# Patient Record
Sex: Female | Born: 2000 | Race: White | Hispanic: No | Marital: Single | Smoking: Never smoker
Health system: Southern US, Community
[De-identification: ages and names within clinical notes are randomized; demographics above are authoritative.]

---
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
## 2021-04-14 ENCOUNTER — Other Ambulatory Visit: Payer: Self-pay

## 2021-04-14 ENCOUNTER — Emergency Department
Admission: EM | Admit: 2021-04-14 | Discharge: 2021-04-14 | Disposition: A | Discharge status: Home / Self Care | Payer: Medicaid Other | Attending: Emergency Medicine | Admitting: Emergency Medicine

## 2021-04-14 ENCOUNTER — Emergency Department: Payer: Medicaid Other

## 2021-04-14 DIAGNOSIS — J029 Acute pharyngitis, unspecified: Secondary | ICD-10-CM | POA: Diagnosis not present

## 2021-04-14 DIAGNOSIS — T148XXA Other injury of unspecified body region, initial encounter: Secondary | ICD-10-CM

## 2021-04-14 DIAGNOSIS — H6982 Other specified disorders of Eustachian tube, left ear: Secondary | ICD-10-CM

## 2021-04-14 DIAGNOSIS — Z20822 Contact with and (suspected) exposure to covid-19: Secondary | ICD-10-CM | POA: Diagnosis not present

## 2021-04-14 DIAGNOSIS — R110 Nausea: Secondary | ICD-10-CM | POA: Insufficient documentation

## 2021-04-14 DIAGNOSIS — R55 Syncope and collapse: Secondary | ICD-10-CM | POA: Insufficient documentation

## 2021-04-14 DIAGNOSIS — H6992 Unspecified Eustachian tube disorder, left ear: Secondary | ICD-10-CM | POA: Diagnosis not present

## 2021-04-14 DIAGNOSIS — L089 Local infection of the skin and subcutaneous tissue, unspecified: Secondary | ICD-10-CM

## 2021-04-14 DIAGNOSIS — R0981 Nasal congestion: Secondary | ICD-10-CM | POA: Insufficient documentation

## 2021-04-14 DIAGNOSIS — R590 Localized enlarged lymph nodes: Secondary | ICD-10-CM | POA: Diagnosis not present

## 2021-04-14 DIAGNOSIS — B349 Viral infection, unspecified: Secondary | ICD-10-CM

## 2021-04-14 LAB — COMPREHENSIVE METABOLIC PANEL
ALT: 10 U/L (ref 0–44)
AST: 20 U/L (ref 15–41)
Albumin: 4.2 g/dL (ref 3.5–5.0)
Alkaline Phosphatase: 73 U/L (ref 38–126)
Anion gap: 10 (ref 5–15)
BUN: 10 mg/dL (ref 6–20)
CO2: 21 mmol/L — ABNORMAL LOW (ref 22–32)
Calcium: 9 mg/dL (ref 8.9–10.3)
Chloride: 101 mmol/L (ref 98–111)
Creatinine, Ser: 0.65 mg/dL (ref 0.44–1.00)
GFR, Estimated: >60 mL/min (ref >60)
Glucose, Bld: 87 mg/dL (ref 70–99)
Potassium: 3.7 mmol/L (ref 3.5–5.1)
Sodium: 132 mmol/L — ABNORMAL LOW (ref 135–145)
Total Bilirubin: 0.8 mg/dL (ref 0.3–1.2)
Total Protein: 8.3 g/dL — ABNORMAL HIGH (ref 6.5–8.1)

## 2021-04-14 LAB — CBG MONITORING, ED: Glucose-Capillary: 94 mg/dL (ref 70–99)

## 2021-04-14 LAB — RESP PANEL BY RT-PCR (FLU A&B, COVID) ARPGX2
Influenza A by PCR: NEGATIVE
Influenza B by PCR: NEGATIVE
SARS Coronavirus 2 by RT PCR: NEGATIVE

## 2021-04-14 LAB — CBC WITH DIFFERENTIAL/PLATELET
Abs Immature Granulocytes: 0.06 10*3/uL (ref 0.00–0.07)
Basophils Absolute: 0.1 10*3/uL (ref 0.0–0.1)
Basophils Relative: 1 %
Eosinophils Absolute: 0.1 10*3/uL (ref 0.0–0.5)
Eosinophils Relative: 1 %
HCT: 41.6 % (ref 36.0–46.0)
Hemoglobin: 13.7 g/dL (ref 12.0–15.0)
Immature Granulocytes: 1 %
Lymphocytes Relative: 15 %
Lymphs Abs: 1.8 10*3/uL (ref 0.7–4.0)
MCH: 29.4 pg (ref 26.0–34.0)
MCHC: 32.9 g/dL (ref 30.0–36.0)
MCV: 89.3 fL (ref 80.0–100.0)
Monocytes Absolute: 0.8 10*3/uL (ref 0.1–1.0)
Monocytes Relative: 7 %
Neutro Abs: 8.7 10*3/uL — ABNORMAL HIGH (ref 1.7–7.7)
Neutrophils Relative %: 75 %
Platelets: 346 10*3/uL (ref 150–400)
RBC: 4.66 MIL/uL (ref 3.87–5.11)
RDW: 12 % (ref 11.5–15.5)
WBC: 11.5 10*3/uL — ABNORMAL HIGH (ref 4.0–10.5)
nRBC: 0 % (ref 0.0–0.2)

## 2021-04-14 LAB — GROUP A STREP BY PCR: Group A Strep by PCR: NOT DETECTED

## 2021-04-14 LAB — TROPONIN I (HIGH SENSITIVITY): Troponin I (High Sensitivity): <2 ng/L (ref <18)

## 2021-04-14 LAB — URINALYSIS, ROUTINE W REFLEX MICROSCOPIC
Bacteria, UA: NONE SEEN
Bilirubin Urine: NEGATIVE
Glucose, UA: NEGATIVE mg/dL
Hgb urine dipstick: NEGATIVE
Ketones, ur: NEGATIVE mg/dL
Leukocytes,Ua: NEGATIVE
Nitrite: NEGATIVE
Protein, ur: NEGATIVE mg/dL
Specific Gravity, Urine: <1.005 — ABNORMAL LOW (ref 1.005–1.030)
pH: 6.5 (ref 5.0–8.0)

## 2021-04-14 LAB — D-DIMER, QUANTITATIVE: D-Dimer, Quant: 0.3 ug/mL-FEU (ref 0.00–0.50)

## 2021-04-14 LAB — POC URINE PREG, ED: Preg Test, Ur: NEGATIVE

## 2021-04-14 MED ORDER — SODIUM CHLORIDE 0.9 % IV BOLUS
1000.0000 mL | Freq: Once | INTRAVENOUS | Status: AC
  Administered 2021-04-14: 1000 mL via INTRAVENOUS

## 2021-04-14 MED ORDER — CEPHALEXIN 500 MG PO CAPS
500.0000 mg | ORAL_CAPSULE | Freq: Once | ORAL | Status: AC
  Administered 2021-04-14: 500 mg via ORAL
  Filled 2021-04-14: qty 1

## 2021-04-14 MED ORDER — CEPHALEXIN 500 MG PO CAPS
500.0000 mg | ORAL_CAPSULE | Freq: Three times a day (TID) | ORAL | 0 refills | Status: AC
Start: 2021-04-14 — End: ?

## 2021-04-14 MED ORDER — METHYLPREDNISOLONE 4 MG PO TBPK: ORAL_TABLET | ORAL | 0 refills | Status: AC

## 2021-04-14 MED ORDER — PREDNISONE 20 MG PO TABS
30.0000 mg | ORAL_TABLET | Freq: Once | ORAL | Status: AC
  Administered 2021-04-14: 30 mg via ORAL
  Filled 2021-04-14: qty 1

## 2021-04-14 NOTE — Discharge Instructions (Addendum)
1.  Take antibiotic as prescribed (Keflex 500mg  3 times daily x7 days). 2.  Take Medrol Dosepak as prescribed. 3.  I recommend you remove the infected earring.  Keep wound clean and dry. 4.  Return to the ER for worsening symptoms, persistent vomiting, difficulty breathing or other concerns

## 2021-04-14 NOTE — ED Notes (Signed)
Pt returned from radiology.

## 2021-04-14 NOTE — ED Provider Notes (Signed)
[facility] Provider Note    Event Date/Time   First MD Initiated Contact with Patient 04/14/21 [phone]     (approximate)   History   Loss of Consciousness   HPI  Nicole Duke is a 21 y.o. female who presents to the ED from home status post syncopal episode.  Patient woke up around 2 AM because her left ear piercing was hurting her.  Recently pierced approximately 3 weeks ago and patient thinks it is infected.  She went to the restroom with her friend in order to clean her ear.  Felt very lightheaded like she was going to pass out so she kneeled down and wedged her head in a corner.  Friend states patient was out for approximately 45 seconds, vomited x1 when she came to.  Patient reports recent cold-like symptoms with congestion and sinus pressure.  Felt like her left ear was going to explode at dinner associated with nausea and dizziness.  Also complaining of sore throat and swollen lymph nodes.  Denies chest pain, shortness of breath, abdominal pain, vomiting or diarrhea.  Travel to [place] 3 weeks ago, takes birth control.  Has not smoked marijuana in several weeks.     Past Medical History  History reviewed. No pertinent past medical history.   Active Problem List  There are no problems to display for this patient.    Past Surgical History  History reviewed. No pertinent surgical history.   Home Medications   Prior to Admission medications   Medication Sig Start Date End Date Taking? Authorizing Provider  [place] 28 0.25-35 MG-MCG tablet Take 1 tablet by mouth daily. 04/01/21  Yes [provider]     Allergies  Patient has no known allergies.   Family History  History reviewed. No pertinent family history.   Physical Exam  Triage Vital Signs: ED Triage Vitals  Enc Vitals Group     BP 04/14/21 0429 119/76     Pulse Rate 04/14/21 0429 100     Resp 04/14/21 0429 (!) 22     Temp 04/14/21 0429 97.9 F (36.6 C)     Temp  Source 04/14/21 0429 Oral     SpO2 04/14/21 0429 98 %     Weight 04/14/21 0428 130 lb (59 kg)     Height 04/14/21 0428 5\' 5"  (1.651 m)     Head Circumference --      Peak Flow --      Pain Score 04/14/21 0427 0     Pain Loc --      Pain Edu? --      Excl. in [place]? --     Updated Vital Signs: BP 116/60    Pulse 96    Temp 97.9 F (36.6 C) (Oral)    Resp 18    Ht 5\' 5"  (1.651 m)    Wt 59 kg    LMP 04/12/2021 (Approximate)    SpO2 100%    BMI 21.63 kg/m    General: Awake, no distress.  CV:  RRR.  Good peripheral perfusion.  Resp:  Normal effort.  CTAB. Abd:  Nontender to light or deep palpation.  No distention.  Other:  No carotid bruits.  Supple neck without meningismus.  Alert and oriented x3.  CN II to XII grossly intact.  5/5 motor strength and sensation all extremities. MAEx4.  No petechiae.  Left TM with fluid, no erythema or rupture.  Left upper ear piercing mildly swollen and oozing.  Oropharynx moderately  erythematous without tonsillar swelling, exudates or peritonsillar abscess.  There is no hoarse voice.  There is no drooling.  Shotty anterior cervical lymphadenopathy.   ED Results / Procedures / Treatments  Labs (all labs ordered are listed, but only abnormal results are displayed) Labs Reviewed  CBC WITH DIFFERENTIAL/PLATELET - Abnormal; Notable for the following components:      Result Value   WBC 11.5 (*)    Neutro Abs 8.7 (*)    All other components within normal limits  COMPREHENSIVE METABOLIC PANEL - Abnormal; Notable for the following components:   Sodium 132 (*)    CO2 21 (*)    Total Protein 8.3 (*)    All other components within normal limits  URINALYSIS, ROUTINE W REFLEX MICROSCOPIC - Abnormal; Notable for the following components:   APPearance CLEAR (*)    Specific Gravity, Urine <1.005 (*)    All other components within normal limits  RESP PANEL BY RT-PCR (FLU A&B, COVID) ARPGX2  GROUP A STREP BY PCR  D-DIMER, QUANTITATIVE (NOT AT [facility])  CBG  MONITORING, ED  POC URINE PREG, ED  TROPONIN I (HIGH SENSITIVITY)     EKG  ED ECG REPORT I, Sharni Negron J, the attending physician, personally viewed and interpreted this ECG.   Date: 04/14/2021  EKG Time: 0431  Rate: 97  Rhythm: normal sinus rhythm  Axis: Normal  Intervals:none  ST&T Change: Nonspecific    RADIOLOGY I have personally reviewed patient's CT head and chest x-ray, as well as the radiology interpretation:  CT head: No ICH  Chest x-ray: No acute cardiopulmonary process  Official radiology report(s): DG Chest 2 View  Result Date: 04/14/2021 CLINICAL DATA:  21 year old female with history of syncope. EXAM: CHEST - 2 VIEW COMPARISON:  No priors. FINDINGS: Lung volumes are normal. No consolidative airspace disease. No pleural effusions. No pneumothorax. No pulmonary nodule or mass noted. Pulmonary vasculature and the cardiomediastinal silhouette are within normal limits. IMPRESSION: No radiographic evidence of acute cardiopulmonary disease. Electronically Signed   By: Vinnie Langton M.D.   On: 04/14/2021 05:26   CT Head Wo Contrast  Result Date: 04/14/2021 CLINICAL DATA:  21 year old female with history of altered mental status. EXAM: CT HEAD WITHOUT CONTRAST TECHNIQUE: Contiguous axial images were obtained from the base of the skull through the vertex without intravenous contrast. RADIATION DOSE REDUCTION: This exam was performed according to the departmental dose-optimization program which includes automated exposure control, adjustment of the mA and/or kV according to patient size and/or use of iterative reconstruction technique. COMPARISON:  No priors. FINDINGS: Brain: No evidence of acute infarction, hemorrhage, hydrocephalus, extra-axial collection or mass lesion/mass effect. Vascular: No hyperdense vessel or unexpected calcification. Skull: Normal. Negative for fracture or focal lesion. Sinuses/Orbits: No acute finding. Mild mucosal thickening in the left maxillary  sinus. Other: None. IMPRESSION: 1. No acute intracranial abnormalities. The appearance of the brain is normal. Electronically Signed   By: Vinnie Langton M.D.   On: 04/14/2021 05:25     PROCEDURES:  Critical Care performed: No  .1-3 Lead EKG Interpretation Performed by: Paulette Blanch, MD Authorized by: Paulette Blanch, MD     Interpretation: normal     ECG rate:  96   ECG rate assessment: normal     Rhythm: sinus rhythm     Ectopy: none     Conduction: normal   Comments:     Patient on cardiac monitor to evaluate for arrhythmias   MEDICATIONS ORDERED IN ED: Medications  sodium chloride  0.9 % bolus 1,000 mL (1,000 mLs Intravenous New Bag/Given 04/14/21 0456)     IMPRESSION / MDM / ASSESSMENT AND PLAN / ED COURSE  I reviewed the triage vital signs and the nursing notes.                             21 year old female presenting with syncope.  Differential diagnosis includes but is not limited to infectious, metabolic, pregnancy, dehydration, viral illness, ACS, PE, etc.  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  The patient is on the cardiac monitor to evaluate for evidence of arrhythmia and/or significant heart rate changes.  Will obtain lab work, imaging studies.  Initiate IV fluid resuscitation.  Will reassess.  Clinical Course as of 04/14/21 [id number]  Fri Apr 14, 2021  0556 Patient was slightly orthostatic by pulse rate.  Laboratory results remarkable for normal H/H, mild hyponatremia, negative troponin, unremarkable D-dimer, negative respiratory panel and group A strep.  IV fluids are infusing.  Urine pregnancy is negative.  Awaiting results of UA.  Patient currently resting in no acute distress.  Voices no complaints. [JS]  [id number] Updated patient on negative UA.  Will place on Medrol Dosepak for eustachian tube dysfunction, Keflex for infected ear piercing and patient will follow up with her PCP.   Strict return precautions given.  Patient and family member verbalized understanding agree with plan of care. [JS]    Clinical Course User Index [JS] Paulette Blanch, MD     FINAL CLINICAL IMPRESSION(S) / ED DIAGNOSES   Final diagnoses:  Syncope, unspecified syncope type  Dysfunction of left eustachian tube     Rx / DC Orders   ED Discharge Orders     None        Note:  This document was prepared using Dragon voice recognition software and may include unintentional dictation errors.   Paulette Blanch, MD 04/14/21 [phone]

## 2021-04-14 NOTE — ED Notes (Signed)
Patient transported to CT 

## 2021-04-14 NOTE — ED Triage Notes (Signed)
Pt presents to ER c/o episode of LOC.  Pt states around 0200 she was going to bathroom and passed out.  Pt states she has had episode like this previously in 2019.  Pt states she did have an episode of emesis after her syncopal episode.  Pt states she has been feeling sick with cold like symptoms for last few days.  Pt currently A&O x4, ambulatory to room, and in NAD.

## 2022-05-02 ENCOUNTER — Encounter: Payer: Self-pay | Admitting: Emergency Medicine

## 2022-05-02 ENCOUNTER — Emergency Department: Payer: Medicaid Other

## 2022-05-02 ENCOUNTER — Emergency Department
Admission: EM | Admit: 2022-05-02 | Discharge: 2022-05-02 | Disposition: A | Discharge status: Home / Self Care | Payer: Medicaid Other | Attending: Emergency Medicine | Admitting: Emergency Medicine

## 2022-05-02 DIAGNOSIS — R002 Palpitations: Secondary | ICD-10-CM | POA: Diagnosis present

## 2022-05-02 DIAGNOSIS — I491 Atrial premature depolarization: Secondary | ICD-10-CM | POA: Diagnosis not present

## 2022-05-02 LAB — CBC
HCT: 37.1 % (ref 36.0–46.0)
Hemoglobin: 12.4 g/dL (ref 12.0–15.0)
MCH: 29.1 pg (ref 26.0–34.0)
MCHC: 33.4 g/dL (ref 30.0–36.0)
MCV: 87.1 fL (ref 80.0–100.0)
Platelets: 427 10*3/uL — ABNORMAL HIGH (ref 150–400)
RBC: 4.26 MIL/uL (ref 3.87–5.11)
RDW: 11.7 % (ref 11.5–15.5)
WBC: 10.4 10*3/uL (ref 4.0–10.5)
nRBC: 0 % (ref 0.0–0.2)

## 2022-05-02 LAB — BASIC METABOLIC PANEL
Anion gap: 10 (ref 5–15)
BUN: 11 mg/dL (ref 6–20)
CO2: 22 mmol/L (ref 22–32)
Calcium: 9.1 mg/dL (ref 8.9–10.3)
Chloride: 105 mmol/L (ref 98–111)
Creatinine, Ser: 0.64 mg/dL (ref 0.44–1.00)
GFR, Estimated: >60 mL/min (ref >60)
Glucose, Bld: 93 mg/dL (ref 70–99)
Potassium: 3.8 mmol/L (ref 3.5–5.1)
Sodium: 137 mmol/L (ref 135–145)

## 2022-05-02 LAB — TROPONIN I (HIGH SENSITIVITY)
Troponin I (High Sensitivity): <2 ng/L (ref <18)
Troponin I (High Sensitivity): <2 ng/L (ref <18)

## 2022-05-02 NOTE — Discharge Instructions (Signed)
Please seek medical attention for any high fevers, chest pain, shortness of breath, change in behavior, persistent vomiting, bloody stool or any other new or concerning symptoms.  

## 2022-05-02 NOTE — ED Triage Notes (Signed)
Pt presents via POV with complaints of palpitations that started 5 days ago. Per Mom, the patient had 38 episodes of her "heart skipping beat". Of note, the patient was seen by her PCP  yesterday who referred her to a Cardiologist. Pt had an EKG performed at there PCP which was unremarkable. Denies dizziness, lightheadedness, N/V/D, medication or dietary changes, or SOB.

## 2022-05-02 NOTE — ED Notes (Signed)
Pt Dc to home. Dc instructions reviewed with all questions answered. Pt voices understanding. Pt ambulatory out of dept with steady gait

## 2022-05-02 NOTE — ED Provider Notes (Signed)
[facility] Provider Note    Event Date/Time   First MD Initiated Contact with Patient 05/02/22 2137     (approximate)   History   Palpitations   HPI  Nicole Duke is a 22 y.o. female who presents to the urgency department today because of concerns for palpitations.  The patient first noticed them a few days ago.  They seem to be increasing.  She feels her heart skipping a beat.  She did count them 1 night and they occurred over 30 times.  Additionally she wore her mom's smart watch which reported possible A-fib.  Patient saw her patient [facility] care doctor who is referred her to cardiology.  However when they again occurred today and she also had a slight amount of shortness of breath and given the fact they are going out of town next week they wanted to be evaluated.      Physical Exam   Triage Vital Signs: ED Triage Vitals  Enc Vitals Group     BP 05/02/22 2009 119/61     Pulse Rate 05/02/22 2009 88     Resp 05/02/22 2009 18     Temp 05/02/22 2009 98.7 F (37.1 C)     Temp src --      SpO2 05/02/22 2009 99 %     Weight 05/02/22 2012 123 lb (55.8 kg)     Height 05/02/22 2012 5' 3"$  (1.6 m)     Head Circumference --      Peak Flow --      Pain Score 05/02/22 2010 5     Pain Loc --      Pain Edu? --      Excl. in [place]? --     Most recent vital signs: Vitals:   05/02/22 2009  BP: 119/61  Pulse: 88  Resp: 18  Temp: 98.7 F (37.1 C)  SpO2: 99%   General: Awake, alert, oriented. CV:  Good peripheral perfusion. No murmurs. Regular rate and rhythm. Resp:  Normal effort.  Abd:  No distention.    ED Results / Procedures / Treatments   Labs (all labs ordered are listed, but only abnormal results are displayed) Labs Reviewed  CBC - Abnormal; Notable for the following components:      Result Value   Platelets 427 (*)    All other components within normal limits  BASIC METABOLIC PANEL  POC URINE PREG, ED  TROPONIN I (HIGH SENSITIVITY)   TROPONIN I (HIGH SENSITIVITY)     EKG  I, Nance Pear, attending physician, personally viewed and interpreted this EKG  EKG Time: 2014 Rate: 84 Rhythm: normal sinus rhythm Axis: normal Intervals: qtc 415 QRS: narrow ST changes: no st elevation Impression: normal ekg   RADIOLOGY I independently interpreted and visualized the CXR. My interpretation: No pneumonia Radiology interpretation:  IMPRESSION:  No active cardiopulmonary disease.      PROCEDURES:  Critical Care performed: No  Procedures   MEDICATIONS ORDERED IN ED: Medications - No data to display   IMPRESSION / MDM / [place] / ED COURSE  I reviewed the triage vital signs and the nursing notes.                              Differential diagnosis includes, but is not limited to, arrhythmia, electrolyte abnormality, anemia.  Patient's presentation is most consistent with acute presentation with potential threat to life or bodily function.  Patient presented to the emergency department today because of concerns for palpitations.  I did evaluate the readout from the smart watch.  Does not appear to be A-fib.  When the patient pointed to when she felt it it appeared to have a PAC.  At this point I do think patient likely suffering from premature contractions.  I discussed this with the patient.  No concerning arrhythmia on EKG today.  Blood work without concerning electrolyte abnormality anemia or elevated troponin.  Chest x-ray without concerning abnormality.  Patient is on birth control, however given lack of tachycardia or tachypnia, lack of chest pain I think PE less likely. Patient felt comfortable deferring testing for PE at this time. I do think it is reasonable for patient be discharged to follow-up with cardiology.   FINAL CLINICAL IMPRESSION(S) / ED DIAGNOSES   Final diagnoses:  Palpitations  PAC (premature atrial contraction)       Note:  This document was prepared using Dragon  voice recognition software and may include unintentional dictation errors.    Nance Pear, MD 05/02/22 2251

## 2022-12-08 IMAGING — CR DG CHEST 2V
1 series · 2 of 2 positions shown · non-contrast
Comparison: No priors.

CLINICAL DATA: 20-year-old female with history of syncope.

EXAM:
CHEST - 2 VIEW

[Series 1: dg chest 2 view · 0.14mm/px · 2 of 2 slices shown]
[im 1/2]
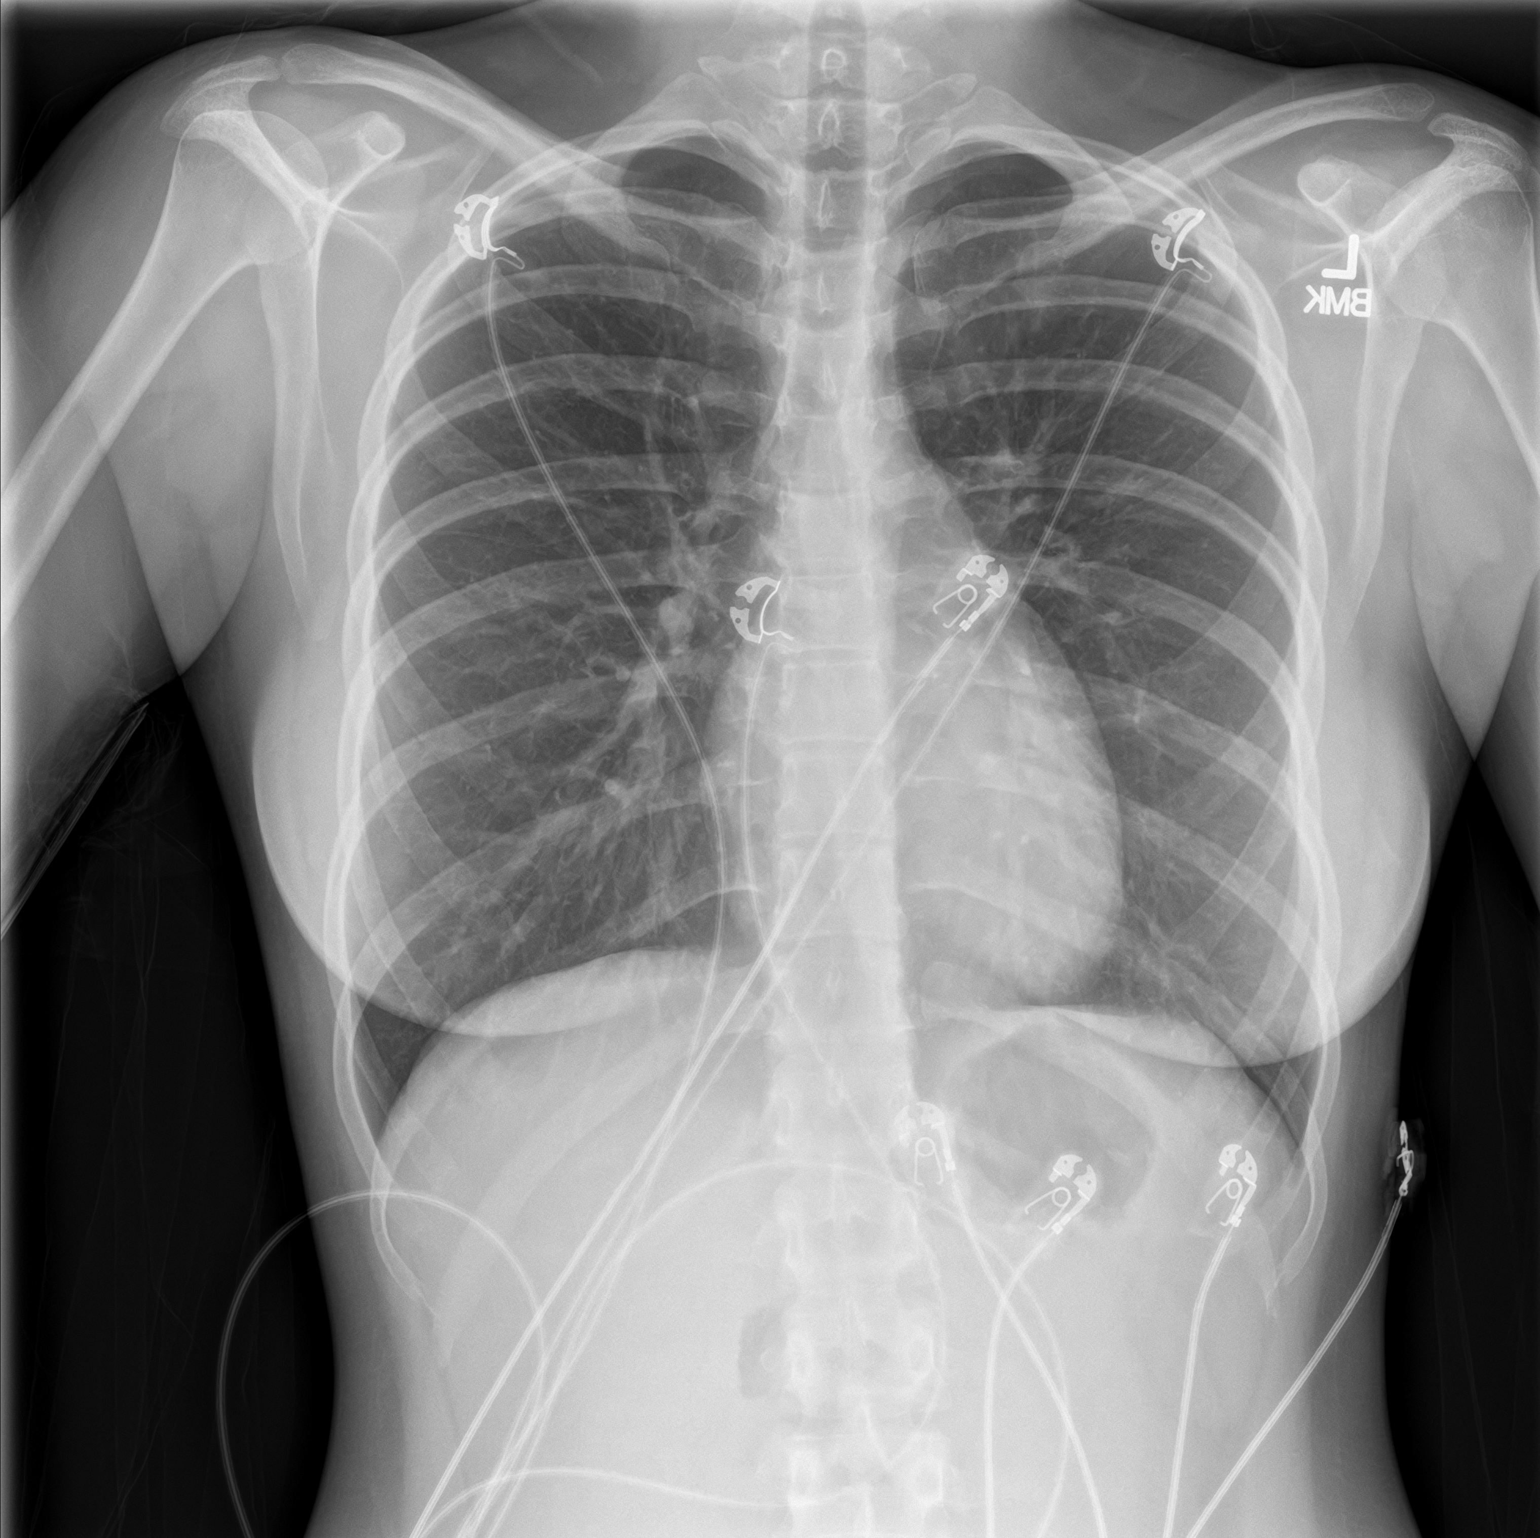
[im 2/2]
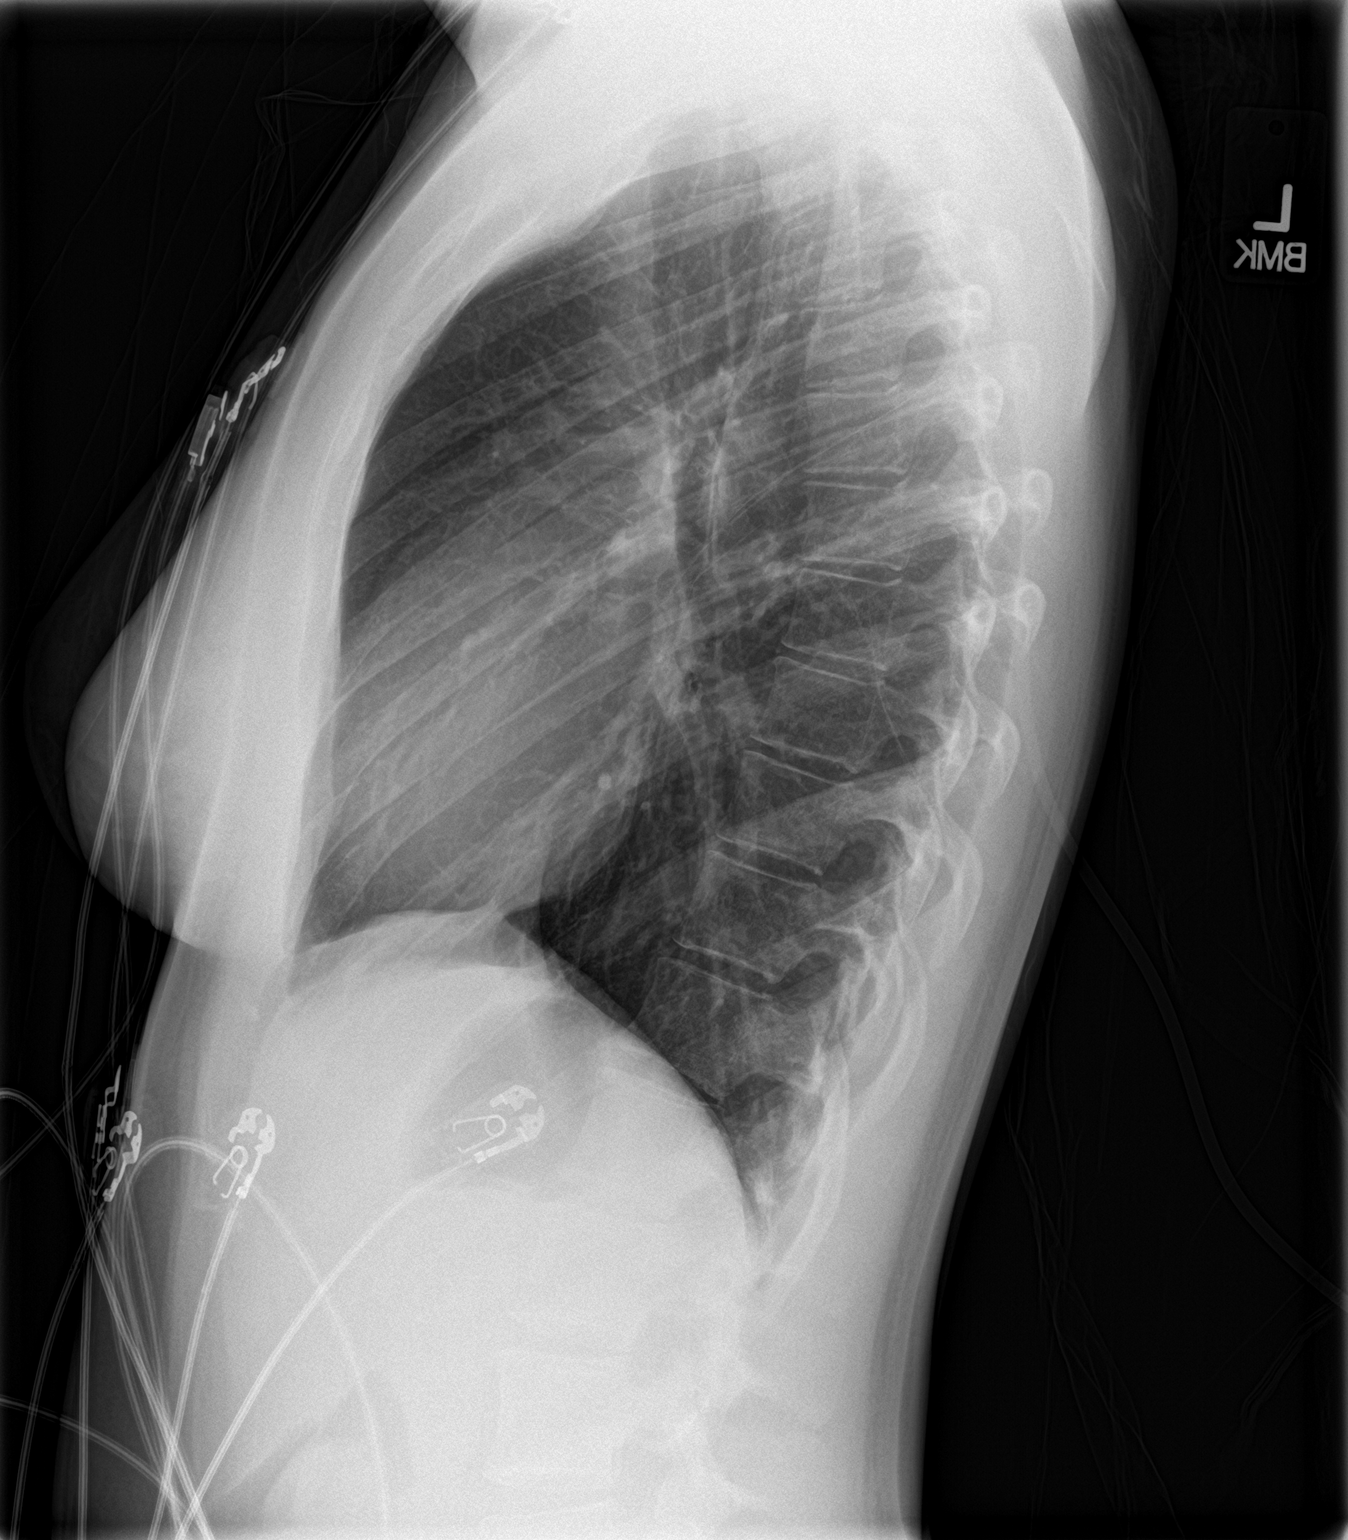

[2 of 2 positions shown; findings below may reference images not displayed]

FINDINGS: Lung volumes are normal. No consolidative airspace disease. No
pleural effusions. No pneumothorax. No pulmonary nodule or mass
noted. Pulmonary vasculature and the cardiomediastinal silhouette
are within normal limits.
IMPRESSION: No radiographic evidence of acute cardiopulmonary disease.

## 2024-04-03 ENCOUNTER — Other Ambulatory Visit: Payer: Self-pay | Admitting: Neurology

## 2024-04-03 DIAGNOSIS — R251 Tremor, unspecified: Secondary | ICD-10-CM

## 2024-04-09 ENCOUNTER — Ambulatory Visit
Admission: RE | Admit: 2024-04-09 | Discharge: 2024-04-09 | Disposition: A | Discharge status: Home / Self Care | Source: Ambulatory Visit | Attending: Neurology | Admitting: Neurology

## 2024-04-09 DIAGNOSIS — R251 Tremor, unspecified: Secondary | ICD-10-CM | POA: Diagnosis present

## 2024-04-09 MED ORDER — GADOBUTROL 1 MMOL/ML IV SOLN
5.0000 mL | Freq: Once | INTRAVENOUS | Status: AC | PRN
  Administered 2024-04-09: 5 mL via INTRAVENOUS
